# Patient Record
Sex: Female | Born: 2000 | Race: Black or African American | Hispanic: No | Marital: Single | Smoking: Never smoker
Health system: Southern US, Community
[De-identification: ages and names within clinical notes are randomized; demographics above are authoritative.]

## PROBLEM LIST (undated history)

## (undated) HISTORY — PX: UMBILICAL HERNIA REPAIR: SHX196

---
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
## 2022-06-11 LAB — COMPREHENSIVE METABOLIC PANEL: EGFR: 90

## 2022-10-01 NOTE — Progress Notes (Unsigned)
Office Visit Note  Patient: Lauren Anderson             Date of Birth: 02/24/2000           MRN: [id number]             PCP: Diamantina Providence, FNP Referring: Diamantina Providence, FNP Visit Date: 10/02/2022 Occupation: Conservation officer, nature  Subjective:  New Patient (Initial Visit) (Patient states she has pain in her knees, hips, and lower back. )   History of Present Illness: Lauren Anderson is a 22 y.o. female here for evaluation of positive ANA and dsDNA Abs associated with chronic pain in hips and knees.  She noticed bilateral knee pain starting since around October of last year.  Most often bothering her after standing for prolonged periods working as a Conservation officer, nature and also causing pain at night.  Not associated with any visible swelling redness and no warmth to the touch.  Later after that the pain also started getting pain in both hips more on the outside of the hip.  This is painful if she lies directly on that side and also painful when on her feet all day. She takes ibuprofen or Tylenol as needed with a partial benefit.  Has not tried any other prescription medicines specifically for this problem.   No previous major joint injuries or surgeries. Outside of joint pain had some episodic chest pain and shortness of breath coming and going.  Not associated with any fevers lymph node swelling or definite infections.  She has a history of previous episodes of pneumonia as an adolescent but no inflammatory or immune disorder. Denies any problems with hair loss, lymphadenopathy, Raynaud's, abnormal bruising or bleeding.  She gets occasional clear blister type mouth sores on the buccal mucosa these are nonpainful. Her grandmother has rheumatoid arthritis.  Labs reviewed 05/2022 ANA pos dsDNA 13 RF neg Vit D 23.6 CBC wnl CMP wnl   Activities of Daily Living:  Patient reports morning stiffness for 30 minutes.   Patient Reports nocturnal pain.  Difficulty dressing/grooming: Denies Difficulty climbing stairs:  Reports Difficulty getting out of chair: Denies Difficulty using hands for taps, buttons, cutlery, and/or writing: Denies  Review of Systems  Constitutional:  Positive for fatigue.  HENT:  Positive for mouth sores and mouth dryness.   Eyes:  Negative for dryness.  Respiratory:  Positive for shortness of breath.   Cardiovascular:  Positive for chest pain. Negative for palpitations.  Gastrointestinal:  Positive for constipation and diarrhea. Negative for blood in stool.  Endocrine: Negative for increased urination.  Genitourinary:  Negative for involuntary urination.  Musculoskeletal:  Positive for joint pain, joint pain, myalgias, muscle weakness, morning stiffness, muscle tenderness and myalgias. Negative for gait problem and joint swelling.  Skin:  Negative for color change, rash, hair loss and sensitivity to sunlight.  Allergic/Immunologic: Negative for susceptible to infections.  Neurological:  Negative for dizziness and headaches.  Hematological:  Negative for swollen glands.  Psychiatric/Behavioral:  Positive for sleep disturbance. Negative for depressed mood. The patient is nervous/anxious.     PMFS History:  Patient Active Problem List   Diagnosis Date Noted   Positive ANA (antinuclear antibody) 10/02/2022   Bilateral knee pain 10/02/2022    History reviewed. No pertinent past medical history.  Family History  Problem Relation Age of Onset   Healthy Mother    Healthy Father    Thyroid disease Sister    Past Surgical History:  Procedure Laterality Date   UMBILICAL HERNIA REPAIR  Social History   Social History Narrative   Not on file   Immunization History  Administered Date(s) Administered   DTaP 04/26/2002, 06/14/2002, 08/18/2002, 10/17/2004, 10/12/2006   HIB (PRP-OMP) 04/26/2002, 06/14/2002, 08/18/2002, 10/17/2004   HPV Bivalent 08/22/2013   Hepatitis A 01/21/2010, 06/02/2011   Hepatitis B Aug 14, 2000, 03/04/2002, 12/07/2002   IPV 04/26/2002, 06/14/2002,  08/18/2002, 10/12/2006   Influenza-Unspecified 12/07/2002, 11/22/2007, 01/21/2010, 12/06/2012   MMR 10/17/2004, 10/12/2006   Meningococcal Conjugate 08/22/2013   Moderna Sars-Covid-2 Vaccination 08/29/2020   Pneumococcal-Unspecified 04/26/2002, 06/14/2002, 12/07/2002, 10/17/2004   Td 08/22/2013   Tdap 08/22/2013   Varicella 10/17/2004, 10/12/2006     Objective: Vital Signs: BP 107/74 (BP Location: Right Arm, Patient Position: Sitting, Cuff Size: Normal)   Pulse 84   Resp 16   Ht 5' 5.5" (1.664 m)   Wt 117 lb (53.1 kg)   LMP 09/22/2022   BMI 19.17 kg/m    Physical Exam HENT:     Nose: Nose normal.     Mouth/Throat:     Mouth: Mucous membranes are moist.     Pharynx: Oropharynx is clear.  Eyes:     Conjunctiva/sclera: Conjunctivae normal.  Cardiovascular:     Rate and Rhythm: Normal rate and regular rhythm.  Pulmonary:     Effort: Pulmonary effort is normal.     Breath sounds: Normal breath sounds.  Musculoskeletal:     Right lower leg: No edema.     Left lower leg: No edema.  Lymphadenopathy:     Cervical: No cervical adenopathy.  Skin:    General: Skin is warm and dry.     Findings: No rash.  Neurological:     Mental Status: She is alert.  Psychiatric:        Mood and Affect: Mood normal.      Musculoskeletal Exam:  Shoulders full ROM no tenderness or swelling Elbows full ROM no tenderness or swelling Wrists full ROM no tenderness or swelling Fingers full ROM no tenderness or swelling, elbow extension past 180 degrees and thumb easily touches forearm flexor surface, normal MCP extension Hip normal internal and external rotation without pain, mild lateral hip tenderness to pressure at greater trochanter Knees full ROM no tenderness or swelling Ankles full ROM no tenderness or swelling   Investigation: No additional findings.  Imaging: No results found.  Recent Labs: No results found for: "WBC", "HGB", "PLT", "NA", "K", "CL", "CO2", "GLUCOSE", "BUN",  "CREATININE", "BILITOT", "ALKPHOS", "AST", "ALT", "PROT", "ALBUMIN", "CALCIUM", "GFRAA", "QFTBGOLD", "QFTBGOLDPLUS"  Speciality Comments: No specialty comments available.  Procedures:  No procedures performed Allergies: Latex   Assessment / Plan:     Visit Diagnoses: Positive ANA (antinuclear antibody) - Plan: Sedimentation rate, C-reactive protein, ANA, Anti-DNA antibody, double-stranded, C3 and C4, Protein / creatinine ratio, urine  Positive ANA and double-stranded DNA and 22 year old African-American female.  Clinically so far has arthralgias otherwise nonspecific.  The oral buccal mucosal ulcers are not typical for lupus.  If labs are abnormal we will need to follow-up and discuss any additional testing or treatment recommendation versus monitoring.  If all negative does not need scheduled follow-up at this time could just see her if symptoms worsen.  Chronic pain of both knees  No obvious structural changes and nothing provocative on exam maneuvers today.  Pain could be inflammatory and not as active today.  Or could be used related.  Is borderline for benign joint hypermobility which could increase the possibility of low-grade strain and sprain injuries without any traumatic mechanism.  Agree with continued use of as needed oral NSAIDs.  Provided some printed proximal strengthening and stretching exercises.  Orders: Orders Placed This Encounter  Procedures   Sedimentation rate   C-reactive protein   ANA   Anti-DNA antibody, double-stranded   C3 and C4   Protein / creatinine ratio, urine   No orders of the defined types were placed in this encounter.    Follow-Up Instructions: Return in about 4 weeks (around 10/30/2022) for New pt +ANA/dsDNA f/u 27mo.   Fuller Plan, MD  Note - This record has been created using [employer].  Chart creation errors have been sought, but may not always  have been located. Such creation errors do not reflect on  the standard of  medical care.

## 2022-10-02 ENCOUNTER — Encounter: Payer: Self-pay | Admitting: Internal Medicine

## 2022-10-02 ENCOUNTER — Ambulatory Visit: Payer: No Typology Code available for payment source | Attending: Internal Medicine | Admitting: Internal Medicine

## 2022-10-02 VITALS — BP 107/74 | HR 84 | Resp 16 | Ht 65.5 in | Wt 117.0 lb

## 2022-10-02 DIAGNOSIS — M25561 Pain in right knee: Secondary | ICD-10-CM

## 2022-10-02 DIAGNOSIS — M25562 Pain in left knee: Secondary | ICD-10-CM

## 2022-10-02 DIAGNOSIS — G8929 Other chronic pain: Secondary | ICD-10-CM | POA: Diagnosis not present

## 2022-10-02 DIAGNOSIS — R768 Other specified abnormal immunological findings in serum: Secondary | ICD-10-CM

## 2022-10-03 LAB — PROTEIN / CREATININE RATIO, URINE
Creatinine, Urine: 159 mg/dL (ref 20–275)
Protein/Creat Ratio: 69 mg/g{creat} (ref 24–184)
Protein/Creatinine Ratio: 0.069 mg/mg{creat} (ref 0.024–0.184)
Total Protein, Urine: 11 mg/dL (ref 5–24)

## 2022-10-03 LAB — ANA: Anti Nuclear Antibody (ANA): NEGATIVE

## 2022-10-03 LAB — SEDIMENTATION RATE: Sed Rate: 2 mm/h (ref 0–20)

## 2022-10-03 LAB — C3 AND C4
C3 Complement: 101 mg/dL (ref 83–193)
C4 Complement: 13 mg/dL — ABNORMAL LOW (ref 15–57)

## 2022-10-03 LAB — C-REACTIVE PROTEIN: CRP: <3 mg/L (ref <8.0)

## 2022-10-03 LAB — ANTI-DNA ANTIBODY, DOUBLE-STRANDED: ds DNA Ab: 12 [IU]/mL — ABNORMAL HIGH

## 2022-11-03 ENCOUNTER — Ambulatory Visit: Payer: No Typology Code available for payment source | Admitting: Internal Medicine
